# Patient Record
Sex: Male | Born: 2000 | Race: Asian | Hispanic: No | Marital: Single | Smoking: Never smoker
Health system: Southern US, Community
[De-identification: ages and names within clinical notes are randomized; demographics above are authoritative.]

## PROBLEM LIST (undated history)

## (undated) DIAGNOSIS — J45909 Unspecified asthma, uncomplicated: Secondary | ICD-10-CM

---
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
## 2021-05-22 ENCOUNTER — Encounter (HOSPITAL_COMMUNITY): Payer: Self-pay | Admitting: Emergency Medicine

## 2021-05-22 ENCOUNTER — Other Ambulatory Visit: Payer: Self-pay

## 2021-05-22 ENCOUNTER — Emergency Department (HOSPITAL_COMMUNITY)
Admission: EM | Admit: 2021-05-22 | Discharge: 2021-05-22 | Disposition: A | Discharge status: Home / Self Care | Payer: 59 | Attending: Emergency Medicine | Admitting: Emergency Medicine

## 2021-05-22 ENCOUNTER — Emergency Department (HOSPITAL_COMMUNITY): Payer: 59

## 2021-05-22 DIAGNOSIS — W208XXA Other cause of strike by thrown, projected or falling object, initial encounter: Secondary | ICD-10-CM | POA: Diagnosis not present

## 2021-05-22 DIAGNOSIS — Y990 Civilian activity done for income or pay: Secondary | ICD-10-CM | POA: Insufficient documentation

## 2021-05-22 DIAGNOSIS — M79671 Pain in right foot: Secondary | ICD-10-CM | POA: Insufficient documentation

## 2021-05-22 DIAGNOSIS — S92514A Nondisplaced fracture of proximal phalanx of right lesser toe(s), initial encounter for closed fracture: Secondary | ICD-10-CM | POA: Diagnosis not present

## 2021-05-22 DIAGNOSIS — S99921A Unspecified injury of right foot, initial encounter: Secondary | ICD-10-CM | POA: Diagnosis present

## 2021-05-22 DIAGNOSIS — R030 Elevated blood-pressure reading, without diagnosis of hypertension: Secondary | ICD-10-CM

## 2021-05-22 DIAGNOSIS — I10 Essential (primary) hypertension: Secondary | ICD-10-CM | POA: Insufficient documentation

## 2021-05-22 MED ORDER — OXYCODONE-ACETAMINOPHEN 5-325 MG PO TABS
1.0000 | ORAL_TABLET | Freq: Once | ORAL | Status: AC
  Administered 2021-05-22: 1 via ORAL
  Filled 2021-05-22: qty 1

## 2021-05-22 NOTE — ED Triage Notes (Signed)
Pt was opening a trailer at work and an anchor weight fell on R foot/ R 4th and 5th toe 1 hour ago.  C/o pain, swelling, and bruising.  Ice placed on foot at triage.

## 2021-05-22 NOTE — Discharge Instructions (Addendum)
It was our pleasure to provide your ER care today - we hope that you feel better.  Wear comfortable/supportive shoe. Elevate foot. Icepack to sore area.   Take acetaminophen or ibuprofen as need.  You were given pain meds in the ER - no driving for the next 6 hours, or with right foot until clear to [place] so.   Follow up with orthopedist in the next 2-3 weeks if symptoms fail to improve/resolve.  Your blood pressure is high today - see attached info, and follow up with primary care doctor in the next 1-2 weeks for recheck.   Return to ER if worse, new symptoms, fevers, spreading redness, severe/intractable pain, or other concern.

## 2021-05-22 NOTE — ED Provider Notes (Signed)
[facility] EMERGENCY DEPARTMENT Provider Note   CSN: [id number] Arrival date & time: 05/22/21  1436     History  Chief Complaint  Patient presents with   Foot Pain    Johnny Carney is a 21 y.o. male.  Patient c/o contusion to right foot at work today. States heavy object fell onto foot.  Dull pain to area, acute onset, moderate, non radiating. Skin intact. Denies other pain or injury. No associated numbness/weakness.  The history is provided by the patient and medical records.  Foot Pain Pertinent negatives include no chest pain and no shortness of breath.      Home Medications Prior to Admission medications   Medication Sig Start Date End Date Taking? Authorizing Provider  acetaminophen (TYLENOL) 500 MG tablet Take 1 tablet (500 mg total) by mouth every 6 (six) hours as needed. 02/10/14   Piepenbrink, Victorino Dike, PA-C  ibuprofen (ADVIL,MOTRIN) 600 MG tablet Take 1 tablet (600 mg total) by mouth every 6 (six) hours as needed. 09/24/17   Dartha Lodge, PA-C  lidocaine (XYLOCAINE) 2 % solution Use as directed 20 mLs in the mouth or throat every 6 (six) hours as needed for mouth pain. Swish and spit 02/10/14   Piepenbrink, Victorino Dike, PA-C  methocarbamol (ROBAXIN) 500 MG tablet Take 1 tablet (500 mg total) by mouth 2 (two) times daily. 09/24/17   Dartha Lodge, PA-C  ondansetron (ZOFRAN) 4 MG tablet Take 1 tablet (4 mg total) by mouth every 8 (eight) hours as needed for nausea or vomiting. 05/31/17   Georgetta Haber, NP  oxymetazoline (AFRIN NASAL SPRAY) 0.05 % nasal spray Place 1 spray into both nostrils 2 (two) times daily. For any further nosebleeds 02/10/14   Piepenbrink, Victorino Dike, PA-C      Allergies    Patient has no known allergies.    Review of Systems   Review of Systems  Constitutional:  Negative for fever.  Respiratory:  Negative for shortness of breath.   Cardiovascular:  Negative for chest pain.  Musculoskeletal:        Contusion foot  Skin:   Negative for wound.  Neurological:  Negative for numbness.   Physical Exam Updated Vital Signs BP (!) 150/104 (BP Location: Left Arm)    Pulse 74    Temp 98.6 F (37 C) (Oral)    Resp 18    SpO2 98%  Physical Exam Vitals and nursing note reviewed.  Constitutional:      Appearance: Normal appearance. He is well-developed.  HENT:     Head: Atraumatic.     Nose: Nose normal.     Mouth/Throat:     Mouth: Mucous membranes are moist.  Eyes:     General: No scleral icterus.    Conjunctiva/sclera: Conjunctivae normal.  Neck:     Trachea: No tracheal deviation.  Cardiovascular:     Rate and Rhythm: Normal rate.     Pulses: Normal pulses.  Pulmonary:     Effort: Pulmonary effort is normal. No accessory muscle usage or respiratory distress.  Genitourinary:    Comments: No cva tenderness. Musculoskeletal:     Cervical back: Neck supple.     Comments: Mild swelling and tenderness to proximal right small toe. Skin is intact. Normal cap refill distally. Dp/pt palp. No other focal bony tenderness on exam of foot/ankle.   Skin:    General: Skin is warm and dry.     Findings: No rash.  Neurological:     Mental Status: He  is alert.     Comments: Alert, speech clear. Right foot/toes nvi.   Psychiatric:        Mood and Affect: Mood normal.    ED Results / Procedures / Treatments   Labs (all labs ordered are listed, but only abnormal results are displayed) Labs Reviewed - No data to display  EKG None  Radiology DG Foot Complete Right  Result Date: 05/22/2021 CLINICAL DATA:  Large object fell on right foot. EXAM: RIGHT FOOT COMPLETE - 3+ VIEW COMPARISON:  Right great toe radiographs 03/06/2014 FINDINGS: Oblique minimally displaced fracture at the base of the fifth toe proximal phalanx, extending to the lateral aspect of the articular surface. The distal fracture fragment is displaced approximately 1 mm laterally. No significant angulation. The fifth toe MTP joint space is intact. Soft  tissue swelling is noted at the base of the fifth toe. No additional fractures in the right foot. 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 IMPRESSION: Intra-articular oblique minimally displaced fracture of the base of the fifth toe proximal phalanx. The fracture line extends to the lateral aspect of the articular surface. Electronically Signed   By: Sherron Ales M.D.   On: 05/22/2021 15:47    Procedures Procedures    Medications Ordered in ED Medications  oxyCODONE-acetaminophen (PERCOCET/ROXICET) 5-325 MG per tablet 1 tablet (1 tablet Oral Given 05/22/21 1613)    ED Course/ Medical Decision Making/ A&P                           Medical Decision Making Problems Addressed: Elevated blood pressure reading: acute illness or injury that poses a threat to life or bodily functions Nondisplaced fracture of proximal phalanx of right lesser toe(s), initial encounter for closed fracture: acute illness or injury  Amount and/or Complexity of Data Reviewed Radiology: ordered and independent interpretation performed. Decision-making details documented in ED Course.  Risk OTC drugs. Prescription drug management.   Xrays.   Reviewed nursing notes and prior charts for additional history. External reports reviewed - prior urgent care visit, xrays.   No meds pta. Pt has ride, does not have to drive.   Percocet po.  Xrays reviewed/interpreted by me - +fx toe.  Discussed w pt.   Pain controlled. Icepack. Post op shoe. Elevate.   Ortho f/u prn.   Return precautions provided.           Final Clinical Impression(s) / ED Diagnoses Final diagnoses:  Nondisplaced fracture of proximal phalanx of right lesser toe(s), initial encounter for closed fracture  Elevated blood pressure reading    Rx / DC Orders ED Discharge Orders     None         Cathren Laine, MD 05/22/21 1656

## 2022-03-08 ENCOUNTER — Ambulatory Visit (INDEPENDENT_AMBULATORY_CARE_PROVIDER_SITE_OTHER): Payer: 59

## 2022-03-08 ENCOUNTER — Ambulatory Visit (HOSPITAL_COMMUNITY)
Admission: EM | Admit: 2022-03-08 | Discharge: 2022-03-08 | Disposition: A | Discharge status: Home / Self Care | Payer: 59 | Attending: Family Medicine | Admitting: Family Medicine

## 2022-03-08 ENCOUNTER — Encounter (HOSPITAL_COMMUNITY): Payer: Self-pay | Admitting: Emergency Medicine

## 2022-03-08 ENCOUNTER — Other Ambulatory Visit: Payer: Self-pay

## 2022-03-08 DIAGNOSIS — M79671 Pain in right foot: Secondary | ICD-10-CM

## 2022-03-08 DIAGNOSIS — M25562 Pain in left knee: Secondary | ICD-10-CM | POA: Diagnosis not present

## 2022-03-08 DIAGNOSIS — M79641 Pain in right hand: Secondary | ICD-10-CM

## 2022-03-08 DIAGNOSIS — M25531 Pain in right wrist: Secondary | ICD-10-CM

## 2022-03-08 MED ORDER — DICLOFENAC SODIUM 75 MG PO TBEC: 75.0000 mg | DELAYED_RELEASE_TABLET | Freq: Two times a day (BID) | ORAL | 0 refills | Status: DC

## 2022-03-08 MED ORDER — CYCLOBENZAPRINE HCL 10 MG PO TABS: ORAL_TABLET | ORAL | 0 refills | Status: DC
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
## 2022-08-15 ENCOUNTER — Encounter (HOSPITAL_COMMUNITY): Payer: Self-pay | Admitting: *Deleted

## 2022-08-15 ENCOUNTER — Ambulatory Visit (HOSPITAL_COMMUNITY)
Admission: EM | Admit: 2022-08-15 | Discharge: 2022-08-15 | Disposition: A | Discharge status: Home / Self Care | Payer: 59 | Attending: Physician Assistant | Admitting: Physician Assistant

## 2022-08-15 ENCOUNTER — Other Ambulatory Visit: Payer: Self-pay

## 2022-08-15 ENCOUNTER — Ambulatory Visit (INDEPENDENT_AMBULATORY_CARE_PROVIDER_SITE_OTHER): Payer: 59

## 2022-08-15 DIAGNOSIS — J208 Acute bronchitis due to other specified organisms: Secondary | ICD-10-CM | POA: Diagnosis not present

## 2022-08-15 DIAGNOSIS — R051 Acute cough: Secondary | ICD-10-CM

## 2022-08-15 MED ORDER — AEROCHAMBER PLUS FLO-VU LARGE MISC
1.0000 | Freq: Once | Status: AC
  Administered 2022-08-15: 1

## 2022-08-15 MED ORDER — ALBUTEROL SULFATE HFA 108 (90 BASE) MCG/ACT IN AERS
2.0000 | INHALATION_SPRAY | Freq: Once | RESPIRATORY_TRACT | Status: AC
  Administered 2022-08-15: 2 via RESPIRATORY_TRACT

## 2022-08-15 MED ORDER — PROMETHAZINE-DM 6.25-15 MG/5ML PO SYRP: 5.0000 mL | ORAL_SOLUTION | Freq: Two times a day (BID) | ORAL | 0 refills | Status: DC | PRN

## 2022-08-15 MED ORDER — AEROCHAMBER PLUS FLO-VU LARGE MISC
Status: AC
  Filled 2022-08-15: qty 1

## 2022-08-15 MED ORDER — ONDANSETRON 4 MG PO TBDP: 4.0000 mg | ORAL_TABLET | Freq: Three times a day (TID) | ORAL | 0 refills | Status: DC | PRN

## 2022-08-15 MED ORDER — ALBUTEROL SULFATE HFA 108 (90 BASE) MCG/ACT IN AERS
INHALATION_SPRAY | RESPIRATORY_TRACT | Status: AC
  Filled 2022-08-15: qty 6.7

## 2022-08-15 MED ORDER — PREDNISONE 10 MG (21) PO TBPK: ORAL_TABLET | ORAL | 0 refills | Status: DC

## 2022-08-15 NOTE — Discharge Instructions (Addendum)
Your x-ray was normal with no evidence of pneumonia.  I am concerned that you have a viral bronchitis.  Use the albuterol inhaler every 4-6 hours as needed.  Start prednisone taper.  Mims not take NSAIDs with this medication including aspirin, ibuprofen/Advil, naproxen/Aleve, diclofenac/Voltaren.  You can use Tylenol/acetaminophen.  I also recommended nasal saline and sinus rinses.  I have called in Zofran to help with nausea.  Please push fluids and eat a bland diet.  If your symptoms or not improving within a week please return for reevaluation.  If anything worsens you should be seen immediately including worsening cough, shortness of breath, fever, nausea/vomiting despite medication, abdominal pain, recurrent diarrhea.

## 2022-08-15 NOTE — ED Triage Notes (Signed)
Pt reports for 4 days he has had a HA,vomiting ,chills ,[facility] diarrhea,cough. Pt 's friend in room has some of same Sx's.

## 2022-08-15 NOTE — ED Provider Notes (Signed)
[facility]    CSN: [id number] Arrival date & time: 08/15/22  0946      History   Chief Complaint Chief Complaint  Patient presents with   Cough   Sore Throat   Headache   Chills    HPI Johnny Carney is a 22 y.o. male.   Patient presents today with a 4-day history of URI symptoms including cough, congestion, headache, chills, nausea, vomiting, diarrhea.  Denies any fever, chest pain, shortness of breath.  Does report possible sick contacts with similar symptoms but does not know what they were ultimately diagnosed with.  He has been taking TheraFlu, DayQuil, NyQuil, allergy medication without improvement of symptoms.  He has never had COVID.  He has not had COVID-19 vaccination.  Denies any history of allergies, asthma, COPD, smoking.  Denies any recent antibiotics or steroids.  Reports that he has had nausea and vomiting with last episode yesterday evening.  He has been able to drink fluids since then but has not been eating solid food.  He has not had recurrent emesis.  He also reports a few episodes of diarrhea but denies any associated melena or hematochezia.    History reviewed. No pertinent past medical history.  There are no problems to display for this patient.   History reviewed. No pertinent surgical history.     Home Medications    Prior to Admission medications   Medication Sig Start Date End Date Taking? Authorizing Provider  ondansetron (ZOFRAN-ODT) 4 MG disintegrating tablet Take 1 tablet (4 mg total) by mouth every 8 (eight) hours as needed for nausea or vomiting. 08/15/22  Yes Veronnica Hennings K, PA-C  predniSONE (STERAPRED UNI-PAK 21 TAB) 10 MG (21) TBPK tablet As directed 08/15/22  Yes Jayion Schneck K, PA-C  promethazine-dextromethorphan (PROMETHAZINE-DM) 6.25-15 MG/5ML syrup Take 5 mLs by mouth 2 (two) times daily as needed for cough. 08/15/22  Yes Jairo Bellew, Noberto Retort, PA-C  acetaminophen (TYLENOL) 500 MG tablet Take 1 tablet (500 mg total) by mouth every  6 (six) hours as needed. 02/10/14   Piepenbrink, Victorino Dike, PA-C  cyclobenzaprine (FLEXERIL) 10 MG tablet Take 1 tablet by mouth 3 times daily as needed for muscle spasm. Warning: May cause drowsiness. 03/08/22   Mardella Layman, MD  diclofenac (VOLTAREN) 75 MG EC tablet Take 1 tablet (75 mg total) by mouth 2 (two) times daily. 03/08/22   Mardella Layman, MD  ibuprofen (ADVIL,MOTRIN) 600 MG tablet Take 1 tablet (600 mg total) by mouth every 6 (six) hours as needed. 09/24/17   Dartha Lodge, PA-C    Family History History reviewed. No pertinent family history.  Social History Social History   Tobacco Use   Smoking status: Never   Smokeless tobacco: Never  Vaping Use   Vaping Use: Never used  Substance Use Topics   Alcohol use: No   Drug use: No     Allergies   Patient has no known allergies.   Review of Systems Review of Systems  Constitutional:  Positive for activity change and appetite change. Negative for fatigue and fever.  HENT:  Positive for congestion and sore throat. Negative for sinus pressure and sneezing.   Respiratory:  Positive for cough and chest tightness. Negative for shortness of breath.   Cardiovascular:  Negative for chest pain.  Gastrointestinal:  Positive for diarrhea, nausea and vomiting. Negative for abdominal pain.  Musculoskeletal:  Negative for arthralgias and myalgias.  Neurological:  Negative for dizziness, light-headedness and headaches.     Physical  Exam Triage Vital Signs ED Triage Vitals  Enc Vitals Group     BP 08/15/22 1106 (!) 144/89     Pulse Rate 08/15/22 1106 83     Resp 08/15/22 1106 18     Temp 08/15/22 1106 99 F (37.2 C)     Temp src --      SpO2 08/15/22 1106 94 %     Weight --      Height --      Head Circumference --      Peak Flow --      Pain Score 08/15/22 1104 7     Pain Loc --      Pain Edu? --      Excl. in GC? --    No data found.  Updated Vital Signs BP (!) 144/89   Pulse 83   Temp 99 F (37.2 C)   Resp 18    SpO2 94%   Visual Acuity Right Eye Distance:   Left Eye Distance:   Bilateral Distance:    Right Eye Near:   Left Eye Near:    Bilateral Near:     Physical Exam Vitals reviewed.  Constitutional:      General: He is awake.     Appearance: Normal appearance. He is well-developed. He is not ill-appearing.     Comments: Very pleasant male appears stated age in no acute distress sitting comfortably in exam room  HENT:     Head: Normocephalic and atraumatic.     Right Ear: Tympanic membrane, ear canal and external ear normal. Tympanic membrane is not erythematous or bulging.     Left Ear: Tympanic membrane, ear canal and external ear normal. Tympanic membrane is not erythematous or bulging.     Nose: Nose normal.     Mouth/Throat:     Pharynx: Uvula midline. Posterior oropharyngeal erythema present. No oropharyngeal exudate.     Tonsils: No tonsillar exudate or tonsillar abscesses.  Cardiovascular:     Rate and Rhythm: Normal rate and regular rhythm.     Heart sounds: Normal heart sounds, S1 normal and S2 normal. No murmur heard. Pulmonary:     Effort: Pulmonary effort is normal. No accessory muscle usage or respiratory distress.     Breath sounds: No stridor. Examination of the right-lower field reveals wheezing. Examination of the left-lower field reveals wheezing. Wheezing present. No rhonchi or rales.     Comments: Reactive cough with deep breathing Lymphadenopathy:     Head:     Right side of head: No submental, submandibular or tonsillar adenopathy.     Left side of head: No submental, submandibular or tonsillar adenopathy.     Cervical: No cervical adenopathy.  Neurological:     Mental Status: He is alert.  Psychiatric:        Behavior: Behavior is cooperative.      UC Treatments / Results  Labs (all labs ordered are listed, but only abnormal results are displayed) Labs Reviewed - No data to display  EKG   Radiology DG Chest 2 View  Result Date:  08/15/2022 CLINICAL DATA:  Persistent cough EXAM: CHEST - 2 VIEW COMPARISON:  None Available. FINDINGS: Cardiac and mediastinal contours are within normal limits. No focal pulmonary opacity. No pleural effusion or pneumothorax. No acute osseous abnormality. IMPRESSION: No acute cardiopulmonary process. Electronically Signed   By: Wiliam Ke M.D.   On: 08/15/2022 12:49    Procedures Procedures (including critical care time)  Medications Ordered in UC Medications  albuterol (VENTOLIN HFA) 108 (90 Base) MCG/ACT inhaler 2 puff (2 puffs Inhalation Given 08/15/22 1125)  AeroChamber Plus Flo-Vu Large MISC 1 each (1 each Other Given 08/15/22 1125)    Initial Impression / Assessment and Plan / UC Course  I have reviewed the triage vital signs and the nursing notes.  Pertinent labs & imaging results that were available during my care of the patient were reviewed by me and considered in my medical decision making (see chart for details).     Patient is well-appearing, afebrile, nontoxic, nontachycardic.  He did have reactive cough and wheezing so was given albuterol with improvement of symptoms and oxygen saturation improved to 98%.  Chest x-ray was obtained given persistent cough that showed no acute cardiopulmonary disease.  Suspect viral bronchitis as etiology of symptoms.  We did discuss potential utility of COVID testing but patient is already at day 4 and he is young and otherwise healthy is not a candidate for antiviral therapy so this would not change her management and therefore was deferred.  Will treat with prednisone.  Offered injection of steroids in clinic but patient declined this.  He was instructed to avoid NSAIDs with prednisone.  He was sent home with albuterol to use every 4-6 hours.  He does report ongoing nausea so was given Zofran to help manage the symptoms.  He was prescribed Promethazine DM for cough.  Discussed that this can be sedating and he is not to drive or drink alcohol with  taking it.  He can use over-the-counter medications for additional symptom relief.  Discussed that if symptoms are improving within a week he should return for reevaluation.  If he has any worsening symptoms he needs to be seen immediately.  Strict return precautions given.  Work excuse note provided.   Final Clinical Impressions(s) / UC Diagnoses   Final diagnoses:  Viral bronchitis  Acute cough     Discharge Instructions      Your x-ray was normal with no evidence of pneumonia.  I am concerned that you have a viral bronchitis.  Use the albuterol inhaler every 4-6 hours as needed.  Start prednisone taper.  Legore not take NSAIDs with this medication including aspirin, ibuprofen/Advil, naproxen/Aleve, diclofenac/Voltaren.  You can use Tylenol/acetaminophen.  I also recommended nasal saline and sinus rinses.  I have called in Zofran to help with nausea.  Please push fluids and eat a bland diet.  If your symptoms or not improving within a week please return for reevaluation.  If anything worsens you should be seen immediately including worsening cough, shortness of breath, fever, nausea/vomiting despite medication, abdominal pain, recurrent diarrhea.     ED Prescriptions     Medication Sig Dispense Auth. Provider   predniSONE (STERAPRED UNI-PAK 21 TAB) 10 MG (21) TBPK tablet As directed 21 tablet Livingston Denner K, PA-C   ondansetron (ZOFRAN-ODT) 4 MG disintegrating tablet Take 1 tablet (4 mg total) by mouth every 8 (eight) hours as needed for nausea or vomiting. 20 tablet Zameria Vogl K, PA-C   promethazine-dextromethorphan (PROMETHAZINE-DM) 6.25-15 MG/5ML syrup Take 5 mLs by mouth 2 (two) times daily as needed for cough. 118 mL Elsye Mccollister K, PA-C      PDMP not reviewed this encounter.   Jeani Hawking, PA-C 08/15/22 1312

## 2022-08-27 ENCOUNTER — Ambulatory Visit (HOSPITAL_COMMUNITY)
Admission: EM | Admit: 2022-08-27 | Discharge: 2022-08-27 | Disposition: A | Discharge status: Home / Self Care | Payer: 59 | Attending: Emergency Medicine | Admitting: Emergency Medicine

## 2022-08-27 ENCOUNTER — Other Ambulatory Visit: Payer: Self-pay

## 2022-08-27 ENCOUNTER — Encounter (HOSPITAL_COMMUNITY): Payer: Self-pay | Admitting: *Deleted

## 2022-08-27 DIAGNOSIS — J069 Acute upper respiratory infection, unspecified: Secondary | ICD-10-CM

## 2022-08-27 DIAGNOSIS — H109 Unspecified conjunctivitis: Secondary | ICD-10-CM | POA: Diagnosis not present

## 2022-08-27 DIAGNOSIS — R051 Acute cough: Secondary | ICD-10-CM | POA: Diagnosis not present

## 2022-08-27 MED ORDER — AMOXICILLIN-POT CLAVULANATE 875-125 MG PO TABS: 1.0000 | ORAL_TABLET | Freq: Two times a day (BID) | ORAL | 0 refills | Status: DC

## 2022-08-27 MED ORDER — GENTAMICIN SULFATE 0.3 % OP SOLN: 1.0000 [drp] | OPHTHALMIC | 0 refills | Status: AC

## 2022-08-27 MED ORDER — PROMETHAZINE-DM 6.25-15 MG/5ML PO SYRP: 5.0000 mL | ORAL_SOLUTION | Freq: Four times a day (QID) | ORAL | 0 refills | Status: DC | PRN

## 2022-08-27 MED ORDER — BENZONATATE 200 MG PO CAPS: 200.0000 mg | ORAL_CAPSULE | Freq: Three times a day (TID) | ORAL | 0 refills | Status: AC

## 2022-08-27 NOTE — ED Provider Notes (Signed)
[facility]    CSN: [id number] Arrival date & time: 08/27/22  1732      History   Chief Complaint Chief Complaint  Patient presents with   Cough   Headache   Eye Drainage    HPI Johnny Carney is a 22 y.o. male.   Patient presents to clinic for ongoing persistent cough since prior to his May 14 visit.  He has since ran out of his cough medicine and returns to clinic.  He has a low-grade fever and is mildly tachycardic.  He did not finish all of his steroids as prescribed.  Denies shortness of breath or wheezing.  Reports 2 episode of posttussive emesis.  He also woke up yesterday morning with his right eye matted shut with crusted yellow discharge.  Reports his eye is red and has a gritty sensation.  Feels like his eye is swollen.  Does not wear contacts.    The history is provided by the patient and medical records.  Cough Associated symptoms: headaches   Associated symptoms: no chest pain, no shortness of breath and no wheezing   Headache Associated symptoms: congestion, cough, sinus pressure and vomiting   Associated symptoms: no abdominal pain     History reviewed. No pertinent past medical history.  There are no problems to display for this patient.   History reviewed. No pertinent surgical history.     Home Medications    Prior to Admission medications   Medication Sig Start Date End Date Taking? Authorizing Provider  amoxicillin-clavulanate (AUGMENTIN) 875-125 MG tablet Take 1 tablet by mouth every 12 (twelve) hours. 08/27/22  Yes Rinaldo Ratel, [place] N, FNP  benzonatate (TESSALON) 200 MG capsule Take 1 capsule (200 mg total) by mouth every 8 (eight) hours for 7 days. 08/27/22 09/03/22 Yes Rinaldo Ratel, [place] N, FNP  gentamicin (GARAMYCIN) 0.3 % ophthalmic solution Place 1 drop into the right eye every 4 (four) hours for 5 days. 08/27/22 09/01/22 Yes Rinaldo Ratel, [place] N, FNP  promethazine-dextromethorphan (PROMETHAZINE-DM) 6.25-15 MG/5ML syrup Take 5 mLs by  mouth 4 (four) times daily as needed for cough. 08/27/22  Yes Rinaldo Ratel, [place] N, FNP  acetaminophen (TYLENOL) 500 MG tablet Take 1 tablet (500 mg total) by mouth every 6 (six) hours as needed. 02/10/14   Piepenbrink, Victorino Dike, PA-C  cyclobenzaprine (FLEXERIL) 10 MG tablet Take 1 tablet by mouth 3 times daily as needed for muscle spasm. Warning: May cause drowsiness. 03/08/22   Mardella Layman, MD  diclofenac (VOLTAREN) 75 MG EC tablet Take 1 tablet (75 mg total) by mouth 2 (two) times daily. 03/08/22   Mardella Layman, MD  ibuprofen (ADVIL,MOTRIN) 600 MG tablet Take 1 tablet (600 mg total) by mouth every 6 (six) hours as needed. 09/24/17   Dartha Lodge, PA-C  ondansetron (ZOFRAN-ODT) 4 MG disintegrating tablet Take 1 tablet (4 mg total) by mouth every 8 (eight) hours as needed for nausea or vomiting. 08/15/22   Raspet, Noberto Retort, PA-C  predniSONE (STERAPRED UNI-PAK 21 TAB) 10 MG (21) TBPK tablet As directed 08/15/22   Raspet, Noberto Retort, PA-C    Family History History reviewed. No pertinent family history.  Social History Social History   Tobacco Use   Smoking status: Never   Smokeless tobacco: Never  Vaping Use   Vaping Use: Never used  Substance Use Topics   Alcohol use: No   Drug use: No     Allergies   Patient has no known allergies.   Review of Systems Review of Systems  HENT:  Positive for congestion and sinus pressure.   Respiratory:  Positive for cough. Negative for shortness of breath and wheezing.   Cardiovascular:  Negative for chest pain.  Gastrointestinal:  Positive for vomiting. Negative for abdominal pain.  Neurological:  Positive for headaches.     Physical Exam Triage Vital Signs ED Triage Vitals  Enc Vitals Group     BP 08/27/22 1744 127/83     Pulse Rate 08/27/22 1744 (!) 103     Resp 08/27/22 1744 18     Temp 08/27/22 1744 99.3 F (37.4 C)     Temp src --      SpO2 08/27/22 1744 94 %     Weight --      Height --      Head Circumference --      Peak Flow --       Pain Score 08/27/22 1742 7     Pain Loc --      Pain Edu? --      Excl. in GC? --    No data found.  Updated Vital Signs BP 127/83   Pulse (!) 103   Temp 99.3 F (37.4 C)   Resp 18   SpO2 94%   Visual Acuity Right Eye Distance:   Left Eye Distance:   Bilateral Distance:    Right Eye Near:   Left Eye Near:    Bilateral Near:     Physical Exam Vitals and nursing note reviewed.  Constitutional:      Appearance: Normal appearance.  HENT:     Head: Normocephalic and atraumatic.     Right Ear: External ear normal.     Left Ear: External ear normal.     Nose: Congestion present.     Mouth/Throat:     Pharynx: Posterior oropharyngeal erythema present.  Eyes:     General: No scleral icterus.       Right eye: Discharge present.  Cardiovascular:     Rate and Rhythm: Normal rate and regular rhythm.     Heart sounds: Normal heart sounds. No murmur heard. Pulmonary:     Effort: Pulmonary effort is normal. No respiratory distress.     Breath sounds: Normal breath sounds.  Musculoskeletal:        General: No swelling. Normal range of motion.     Cervical back: Normal range of motion.  Lymphadenopathy:     Cervical: Cervical adenopathy present.  Skin:    General: Skin is warm and dry.  Neurological:     General: No focal deficit present.     Mental Status: He is alert and oriented to person, place, and time.  Psychiatric:        Mood and Affect: Mood normal.        Behavior: Behavior normal.      UC Treatments / Results  Labs (all labs ordered are listed, but only abnormal results are displayed) Labs Reviewed - No data to display  EKG   Radiology No results found.  Procedures Procedures (including critical care time)  Medications Ordered in UC Medications - No data to display  Initial Impression / Assessment and Plan / UC Course  I have reviewed the triage vital signs and the nursing notes.  Pertinent labs & imaging results that were available  during my care of the patient were reviewed by me and considered in my medical decision making (see chart for details).  Vitals in triage reviewed, patient is hemodynamically stable.  Lungs vesicular  posteriorly.  Posterior pharynx with erythema, reports sinus congestion.  Symptoms have been persistent for over 2 weeks now, will cover with Augmentin for bacterial upper respiratory tract infection.  Cough medication provided and symptomatic management discussed.  Deferred repeat imaging as lungs were clear.  Plan of care, follow-up care and return precautions discussed, no questions at this time.     Final Clinical Impressions(s) / UC Diagnoses   Final diagnoses:  Upper respiratory tract infection, unspecified type  Acute cough  Bacterial conjunctivitis of right eye     Discharge Instructions      I am covering you with antibiotics for bacterial upper respiratory tract infection.  Please take all antibiotics as prescribed until finished.  You can take the Tessalon Perles throughout the day and the promethazine syrup at night.  Please continue alternating between Tylenol Motrin as needed for fever, body aches and discomfort.  Please use the antibiotic eyedrops as prescribed.  Please wash your hands prior to and after application.     ED Prescriptions     Medication Sig Dispense Auth. Provider   amoxicillin-clavulanate (AUGMENTIN) 875-125 MG tablet Take 1 tablet by mouth every 12 (twelve) hours. 14 tablet Rinaldo Ratel, [place] N, [place]   benzonatate (TESSALON) 200 MG capsule Take 1 capsule (200 mg total) by mouth every 8 (eight) hours for 7 days. 21 capsule Rinaldo Ratel, [place] N, [place]   promethazine-dextromethorphan (PROMETHAZINE-DM) 6.25-15 MG/5ML syrup Take 5 mLs by mouth 4 (four) times daily as needed for cough. 118 mL Rinaldo Ratel, [place] N, [place]   gentamicin (GARAMYCIN) 0.3 % ophthalmic solution Place 1 drop into the right eye every 4 (four) hours for 5 days. 5 mL Nadya Hopwood, [place] N, FNP       PDMP not reviewed this encounter.   Suzanna Zahn, [place] N, [place] 08/27/22 [phone]

## 2022-08-27 NOTE — ED Triage Notes (Signed)
Pt reports for past 3 days he has had a cough ,HA,Rt eye drainage . Pt also vomited x 2 this morning. Pt last the ability to smell and taste.

## 2022-08-27 NOTE — Discharge Instructions (Addendum)
I am covering you with antibiotics for bacterial upper respiratory tract infection.  Please take all antibiotics as prescribed until finished.  You can take the Tessalon Perles throughout the day and the promethazine syrup at night.  Please continue alternating between Tylenol Motrin as needed for fever, body aches and discomfort.  Please use the antibiotic eyedrops as prescribed.  Please wash your hands prior to and after application.

## 2023-08-19 ENCOUNTER — Encounter (HOSPITAL_COMMUNITY): Payer: Self-pay | Admitting: Emergency Medicine

## 2023-08-19 ENCOUNTER — Ambulatory Visit (HOSPITAL_COMMUNITY)
Admission: EM | Admit: 2023-08-19 | Discharge: 2023-08-19 | Disposition: A | Discharge status: Home / Self Care | Attending: Family Medicine | Admitting: Family Medicine

## 2023-08-19 ENCOUNTER — Ambulatory Visit (INDEPENDENT_AMBULATORY_CARE_PROVIDER_SITE_OTHER)

## 2023-08-19 ENCOUNTER — Other Ambulatory Visit: Payer: Self-pay

## 2023-08-19 DIAGNOSIS — R051 Acute cough: Secondary | ICD-10-CM | POA: Diagnosis not present

## 2023-08-19 DIAGNOSIS — J4521 Mild intermittent asthma with (acute) exacerbation: Secondary | ICD-10-CM

## 2023-08-19 MED ORDER — PREDNISONE 20 MG PO TABS: 40.0000 mg | ORAL_TABLET | Freq: Every day | ORAL | 0 refills | Status: AC

## 2023-08-19 MED ORDER — BENZONATATE 100 MG PO CAPS: 100.0000 mg | ORAL_CAPSULE | Freq: Three times a day (TID) | ORAL | 0 refills | Status: DC | PRN

## 2023-08-19 MED ORDER — IPRATROPIUM-ALBUTEROL 0.5-2.5 (3) MG/3ML IN SOLN
RESPIRATORY_TRACT | Status: AC
  Filled 2023-08-19: qty 3

## 2023-08-19 MED ORDER — IPRATROPIUM-ALBUTEROL 0.5-2.5 (3) MG/3ML IN SOLN
3.0000 mL | Freq: Once | RESPIRATORY_TRACT | Status: AC
  Administered 2023-08-19: 3 mL via RESPIRATORY_TRACT

## 2023-08-19 NOTE — ED Triage Notes (Signed)
 Pt reports persistent cough, chest congestion and wheezing for the past 2 weeks. Using rescue inhaler with not relief.

## 2023-08-19 NOTE — Discharge Instructions (Signed)
 Chest x-ray by my review was negative.  The radiologist will also read your x-ray, and if their interpretation differs significantly from mine, and the management of your condition would change, we will call you.  Take prednisone  20 mg--2 daily for 5 days  Take benzonatate  100 mg, 1 tab every 8 hours as needed for cough.  We gave you 1 breathing treatment of albuterol /ipratropium

## 2023-08-19 NOTE — ED Provider Notes (Signed)
 [facility]    CSN: [id number] Arrival date & time: 08/19/23  1645      History   Chief Complaint Chief Complaint  Patient presents with   Cough    HPI Johnny Carney is a 23 y.o. male.    Cough Here for cough and wheezing/chest tightness. Symptoms began about 2 weeks ago.  No rhinorrhea or postnasal drainage or fever or chills.  No vomiting or diarrhea.  He was prescribed an inhaler after few weeks ago for possible bronchitis.  For this illness the inhaler of albuterol  has not been helping as much.  NKDA  History reviewed. No pertinent past medical history.  There are no active problems to display for this patient.   History reviewed. No pertinent surgical history.     Home Medications    Prior to Admission medications   Medication Sig Start Date End Date Taking? Authorizing Provider  benzonatate  (TESSALON ) 100 MG capsule Take 1 capsule (100 mg total) by mouth 3 (three) times daily as needed for cough. 08/19/23  Yes Ann Keto, MD  predniSONE  (DELTASONE ) 20 MG tablet Take 2 tablets (40 mg total) by mouth daily with breakfast for 5 days. 08/19/23 08/24/23 Yes Dena Esperanza, Paige Boatman, MD  acetaminophen  (TYLENOL ) 500 MG tablet Take 1 tablet (500 mg total) by mouth every 6 (six) hours as needed. 02/10/14   Piepenbrink, Bridgette Campus, PA-C  cyclobenzaprine  (FLEXERIL ) 10 MG tablet Take 1 tablet by mouth 3 times daily as needed for muscle spasm. Warning: May cause drowsiness. 03/08/22   Afton Albright, MD    Family History History reviewed. No pertinent family history.  Social History Social History   Tobacco Use   Smoking status: Never   Smokeless tobacco: Never  Vaping Use   Vaping status: Never Used  Substance Use Topics   Alcohol use: No   Drug use: No     Allergies   Patient has no known allergies.   Review of Systems Review of Systems  Respiratory:  Positive for cough.      Physical Exam Triage Vital Signs ED Triage Vitals [08/19/23  1722]  Encounter Vitals Group     BP 135/85     Systolic BP Percentile      Diastolic BP Percentile      Pulse Rate 87     Resp 20     Temp 98.2 F (36.8 C)     Temp Source Oral     SpO2 98 %     Weight      Height      Head Circumference      Peak Flow      Pain Score 0     Pain Loc      Pain Education      Exclude from Growth Chart    No data found.  Updated Vital Signs BP 135/85 (BP Location: Right Arm)   Pulse 87   Temp 98.2 F (36.8 C) (Oral)   Resp 20   SpO2 98%   Visual Acuity Right Eye Distance:   Left Eye Distance:   Bilateral Distance:    Right Eye Near:   Left Eye Near:    Bilateral Near:     Physical Exam Vitals reviewed.  Constitutional:      General: He is not in acute distress.    Appearance: He is not ill-appearing, toxic-appearing or diaphoretic.  HENT:     Mouth/Throat:     Mouth: Mucous membranes are moist.  Eyes:  Extraocular Movements: Extraocular movements intact.     Pupils: Pupils are equal, round, and reactive to light.  Cardiovascular:     Rate and Rhythm: Normal rate and regular rhythm.     Heart sounds: No murmur heard. Pulmonary:     Effort: No respiratory distress.     Breath sounds: No stridor. No rhonchi or rales.     Comments: Expiratory phase is quiet, but I [place] hear air movement.  There are wheezes when he coughs. Musculoskeletal:     Cervical back: Neck supple.  Lymphadenopathy:     Cervical: No cervical adenopathy.  Skin:    Coloration: Skin is not jaundiced or pale.  Neurological:     General: No focal deficit present.     Mental Status: He is alert and oriented to person, place, and time.  Psychiatric:        Behavior: Behavior normal.      UC Treatments / Results  Labs (all labs ordered are listed, but only abnormal results are displayed) Labs Reviewed - No data to display  EKG   Radiology No results found.  Procedures Procedures (including critical care time)  Medications Ordered in  UC Medications  ipratropium-albuterol  (DUONEB) 0.5-2.5 (3) MG/3ML nebulizer solution 3 mL (3 mLs Nebulization Given 08/19/23 1749)    Initial Impression / Assessment and Plan / UC Course  I have reviewed the triage vital signs and the nursing notes.  Pertinent labs & imaging results that were available during my care of the patient were reviewed by me and considered in my medical decision making (see chart for details).     Chest x-ray by my review is negative for any x-ray.  He did feel better after the DuoNeb treatment. Air movement was slightly improved and there was no wheezing.  Prednisone  and Tessalon  Perles were sent in for the asthma exacerbation and the cough.  Staff will help him get a primary care appointment Final Clinical Impressions(s) / UC Diagnoses   Final diagnoses:  Acute cough  Mild intermittent asthma with exacerbation     Discharge Instructions      Chest x-ray by my review was negative.  The radiologist will also read your x-ray, and if their interpretation differs significantly from mine, and the management of your condition would change, we will call you.  Take prednisone  20 mg--2 daily for 5 days  Take benzonatate  100 mg, 1 tab every 8 hours as needed for cough.  We gave you 1 breathing treatment of albuterol /ipratropium   ED Prescriptions     Medication Sig Dispense Auth. Provider   benzonatate  (TESSALON ) 100 MG capsule Take 1 capsule (100 mg total) by mouth 3 (three) times daily as needed for cough. 21 capsule Ann Keto, MD   predniSONE  (DELTASONE ) 20 MG tablet Take 2 tablets (40 mg total) by mouth daily with breakfast for 5 days. 10 tablet Ellsworth Haas Cassundra Mckeever K, MD      PDMP not reviewed this encounter.   Ann Keto, MD 08/19/23 [phone]

## 2023-08-24 ENCOUNTER — Ambulatory Visit: Admitting: Family Medicine

## 2023-08-28 ENCOUNTER — Ambulatory Visit (INDEPENDENT_AMBULATORY_CARE_PROVIDER_SITE_OTHER)

## 2023-08-28 ENCOUNTER — Encounter (HOSPITAL_COMMUNITY): Payer: Self-pay

## 2023-08-28 ENCOUNTER — Ambulatory Visit (HOSPITAL_COMMUNITY)
Admission: EM | Admit: 2023-08-28 | Discharge: 2023-08-28 | Disposition: A | Discharge status: Home / Self Care | Attending: Emergency Medicine | Admitting: Emergency Medicine

## 2023-08-28 DIAGNOSIS — R053 Chronic cough: Secondary | ICD-10-CM

## 2023-08-28 MED ORDER — PROMETHAZINE-DM 6.25-15 MG/5ML PO SYRP: 5.0000 mL | ORAL_SOLUTION | Freq: Four times a day (QID) | ORAL | 0 refills | Status: DC | PRN

## 2023-08-28 MED ORDER — ALBUTEROL SULFATE HFA 108 (90 BASE) MCG/ACT IN AERS: 2.0000 | INHALATION_SPRAY | Freq: Four times a day (QID) | RESPIRATORY_TRACT | 1 refills | Status: DC | PRN

## 2023-08-28 MED ORDER — DOXYCYCLINE HYCLATE 100 MG PO CAPS: 100.0000 mg | ORAL_CAPSULE | Freq: Two times a day (BID) | ORAL | 0 refills | Status: AC

## 2023-08-28 NOTE — Discharge Instructions (Addendum)
 Doxycycline antibiotic twice daily for 7 days Take with food to avoid upset stomach. Finish all the pills It might take 3-4 days to start working  Use albuterol  inhaler three times daily for the next 4-5 days in a row  The promethazine  DM cough syrup can be used up to 4 times daily. If this medication makes you drowsy, take only once before bed.  Any worsening symptoms please go to the emergency department   You can scan the QR code on the last page to get established with a primary care provider

## 2023-08-28 NOTE — ED Triage Notes (Signed)
 Pt states seen and tx'd on 5/18 for same with no relief. C/o cough 3 wks and now coughing up blood x3 days.

## 2023-08-28 NOTE — ED Provider Notes (Signed)
 [facility]    CSN: [id number] Arrival date & time: 08/28/23  1752      History   Chief Complaint Chief Complaint  Patient presents with   Cough    HPI Johnny Carney is a 23 y.o. male.  3 week history of cough Seen 9 days ago for this and had wheezing, was given duoneb that improved symptoms. Chest xray was negative. Sent home with tessalon  and prednisone  burst.   He is here today concerned that cough has persisted. Had a few episodes of pink tinged sputum after coughing. No bright red blood or copious amounts. No new fevers No wheezing or chest tightness Feels short of breath during cough fits  History reviewed. No pertinent past medical history.  There are no active problems to display for this patient.   History reviewed. No pertinent surgical history.     Home Medications    Prior to Admission medications   Medication Sig Start Date End Date Taking? Authorizing Provider  albuterol  (VENTOLIN  HFA) 108 (90 Base) MCG/ACT inhaler Inhale 2 puffs into the lungs every 6 (six) hours as needed for wheezing or shortness of breath. 08/28/23  Yes Yaritzy Huser, Ivette Marks, PA-C  doxycycline (VIBRAMYCIN) 100 MG capsule Take 1 capsule (100 mg total) by mouth 2 (two) times daily for 7 days. 08/28/23 09/04/23 Yes Lottie Siska, Ivette Marks, PA-C  promethazine -dextromethorphan (PROMETHAZINE -DM) 6.25-15 MG/5ML syrup Take 5 mLs by mouth 4 (four) times daily as needed for cough. 08/28/23  Yes Channah Godeaux, Ivette Marks, PA-C  acetaminophen  (TYLENOL ) 500 MG tablet Take 1 tablet (500 mg total) by mouth every 6 (six) hours as needed. 02/10/14   Piepenbrink, Bridgette Campus, PA-C  cyclobenzaprine  (FLEXERIL ) 10 MG tablet Take 1 tablet by mouth 3 times daily as needed for muscle spasm. Warning: May cause drowsiness. 03/08/22   Afton Albright, MD    Family History History reviewed. No pertinent family history.  Social History Social History   Tobacco Use   Smoking status: Never   Smokeless tobacco: Never  Vaping Use    Vaping status: Never Used  Substance Use Topics   Alcohol use: No   Drug use: No     Allergies   Patient has no known allergies.   Review of Systems Review of Systems  Respiratory:  Positive for cough.    Per HPI  Physical Exam Triage Vital Signs ED Triage Vitals [08/28/23 1915]  Encounter Vitals Group     BP 139/75     Systolic BP Percentile      Diastolic BP Percentile      Pulse Rate 68     Resp 18     Temp 98 F (36.7 C)     Temp Source Oral     SpO2 96 %     Weight      Height      Head Circumference      Peak Flow      Pain Score 3     Pain Loc      Pain Education      Exclude from Growth Chart    No data found.  Updated Vital Signs BP 139/75 (BP Location: Left Arm)   Pulse 68   Temp 98 F (36.7 C) (Oral)   Resp 18   SpO2 96%   Physical Exam Vitals and nursing note reviewed.  Constitutional:      Appearance: He is not ill-appearing.  HENT:     Nose: No congestion or rhinorrhea.     Mouth/Throat:  Mouth: Mucous membranes are moist.     Pharynx: Oropharynx is clear. No posterior oropharyngeal erythema.  Eyes:     Conjunctiva/sclera: Conjunctivae normal.  Cardiovascular:     Rate and Rhythm: Normal rate and regular rhythm.     Pulses: Normal pulses.     Heart sounds: Normal heart sounds.  Pulmonary:     Effort: Pulmonary effort is normal. No respiratory distress.     Breath sounds: Normal breath sounds. No wheezing, rhonchi or rales.     Comments: Clear lungs. Frequent dry sounding cough Musculoskeletal:     Cervical back: Normal range of motion.  Lymphadenopathy:     Cervical: No cervical adenopathy.  Skin:    General: Skin is warm and dry.  Neurological:     Mental Status: He is alert and oriented to person, place, and time.     UC Treatments / Results  Labs (all labs ordered are listed, but only abnormal results are displayed) Labs Reviewed - No data to display  EKG   Radiology DG Chest 2 View Result Date:  08/28/2023 CLINICAL DATA:  cough x 3 weeks EXAM: CHEST - 2 VIEW COMPARISON:  08/19/2023. FINDINGS: Cardiac silhouette is unremarkable. No pneumothorax or pleural effusion. The lungs are clear. The visualized skeletal structures are unremarkable. IMPRESSION: No acute cardiopulmonary process. Electronically Signed   By: Sydell Eva M.D.   On: 08/28/2023 19:50    Procedures Procedures (including critical care time)  Medications Ordered in UC Medications - No data to display  Initial Impression / Assessment and Plan / UC Course  I have reviewed the triage vital signs and the nursing notes.  Pertinent labs & imaging results that were available during my care of the patient were reviewed by me and considered in my medical decision making (see chart for details).  Afebrile, well appearing, clear lungs. Vitals stable, normocardic.  Chest xray today again negative. Images independently reviewed by me, agree with radiology interpretation. With duration treat for bacterial etiology. Doxycycline BID x 7 days. Also sent promethazine  DM and new albuterol  inhaler. Patient declines IM steroid tonight  Return and ED precautions   Final Clinical Impressions(s) / UC Diagnoses   Final diagnoses:  Persistent cough for 3 weeks or longer     Discharge Instructions      Doxycycline antibiotic twice daily for 7 days Take with food to avoid upset stomach. Finish all the pills It might take 3-4 days to start working  Use albuterol  inhaler three times daily for the next 4-5 days in a row  The promethazine  DM cough syrup can be used up to 4 times daily. If this medication makes you drowsy, take only once before bed.  Any worsening symptoms please go to the emergency department   You can scan the QR code on the last page to get established with a primary care provider    ED Prescriptions     Medication Sig Dispense Auth. Provider   albuterol  (VENTOLIN  HFA) 108 (90 Base) MCG/ACT inhaler Inhale 2  puffs into the lungs every 6 (six) hours as needed for wheezing or shortness of breath. 8 g Kaylib Furness, PA-C   promethazine -dextromethorphan (PROMETHAZINE -DM) 6.25-15 MG/5ML syrup Take 5 mLs by mouth 4 (four) times daily as needed for cough. 240 mL Hudsyn Champine, PA-C   doxycycline (VIBRAMYCIN) 100 MG capsule Take 1 capsule (100 mg total) by mouth 2 (two) times daily for 7 days. 14 capsule Zollie Clemence, Ivette Marks, PA-C      PDMP not reviewed  this encounter.   Runa Whittingham, Beth Brooke 08/28/23 2005

## 2023-10-23 ENCOUNTER — Ambulatory Visit (HOSPITAL_COMMUNITY)
Admission: EM | Admit: 2023-10-23 | Discharge: 2023-10-23 | Disposition: A | Discharge status: Home / Self Care | Attending: Family Medicine | Admitting: Family Medicine

## 2023-10-23 ENCOUNTER — Encounter (HOSPITAL_COMMUNITY): Payer: Self-pay

## 2023-10-23 ENCOUNTER — Ambulatory Visit (INDEPENDENT_AMBULATORY_CARE_PROVIDER_SITE_OTHER)

## 2023-10-23 DIAGNOSIS — R058 Other specified cough: Secondary | ICD-10-CM

## 2023-10-23 LAB — POC COVID19/FLU A&B COMBO
Covid Antigen, POC: NEGATIVE
Influenza A Antigen, POC: NEGATIVE
Influenza B Antigen, POC: NEGATIVE

## 2023-10-23 MED ORDER — PANTOPRAZOLE SODIUM 40 MG PO TBEC: 40.0000 mg | DELAYED_RELEASE_TABLET | Freq: Every day | ORAL | 2 refills | Status: AC

## 2023-10-23 MED ORDER — PANTOPRAZOLE SODIUM 40 MG PO TBEC: 40.0000 mg | DELAYED_RELEASE_TABLET | Freq: Every day | ORAL | 2 refills | Status: DC

## 2023-10-23 MED ORDER — HYDROCODONE BIT-HOMATROP MBR 5-1.5 MG/5ML PO SOLN: 5.0000 mL | Freq: Four times a day (QID) | ORAL | 0 refills | Status: AC | PRN

## 2023-10-23 MED ORDER — ALBUTEROL SULFATE HFA 108 (90 BASE) MCG/ACT IN AERS: 2.0000 | INHALATION_SPRAY | Freq: Four times a day (QID) | RESPIRATORY_TRACT | 2 refills | Status: DC | PRN

## 2023-10-23 MED ORDER — HYDROCODONE BIT-HOMATROP MBR 5-1.5 MG/5ML PO SOLN: 5.0000 mL | Freq: Four times a day (QID) | ORAL | 0 refills | Status: DC | PRN

## 2023-10-23 NOTE — Discharge Instructions (Addendum)
 Be aware, your cough medication may cause drowsiness. Please do not drive, operate heavy machinery or make important decisions while on this medication, it can cloud your judgement.

## 2023-10-23 NOTE — ED Triage Notes (Signed)
 Pt states cough for the past 3 months.  Seen here 3 times for the same.  States now he has the chills and headache for the past 2 days.

## 2023-10-24 NOTE — ED Provider Notes (Signed)
 [facility]   [id number] 10/23/23 Arrival Time: 8161  ASSESSMENT & PLAN:  1. Cough present for greater than 3 weeks    I have personally viewed and independently interpreted the imaging studies ordered this visit. CXR: no acute changes.  Long-standing intermittent similar symptoms. Ques possible GERD etiology; discussed. Trial of Protonix . Cough is affecting sleep; cough med given.  Meds ordered this encounter  Medications   albuterol  (VENTOLIN  HFA) 108 (90 Base) MCG/ACT inhaler    Sig: Inhale 2 puffs into the lungs every 6 (six) hours as needed for wheezing or shortness of breath.    Dispense:  8 g    Refill:  2   HYDROcodone  bit-homatropine (HYCODAN) 5-1.5 MG/5ML syrup    Sig: Take 5 mLs by mouth every 6 (six) hours as needed for cough.    Dispense:  90 mL    Refill:  0   pantoprazole  (PROTONIX ) 40 MG tablet    Sig: Take 1 tablet (40 mg total) by mouth daily.    Dispense:  30 tablet    Refill:  2   May need allergy/pulm referral if not improving over next few weeks.  Results for orders placed or performed during the hospital encounter of 10/23/23  POC Covid19/Flu A&B Antigen   Collection Time: 10/23/23  7:41 PM  Result Value Ref Range   Influenza A Antigen, POC Negative Negative   Influenza B Antigen, POC Negative Negative   Covid Antigen, POC Negative Negative     Follow-up Information     [facility].   Specialty: Urgent Care Why: If worsening or failing to improve as anticipated. Contact information: [address] [place]  [address] [phone]                Reviewed expectations re: course of current medical issues. Questions answered. Outlined signs and symptoms indicating need for more acute intervention. Understanding verbalized. After Visit Summary given.   SUBJECTIVE: History from: Patient. Johnny Carney is a 23 y.o. male. Pt states cough for the past 3 months.  Seen here 3 times  for the same.  States now he has the chills and headache for the past 2 days.  Has been seen in past for same. Denies: fever. Normal PO intake without n/v/d.  OBJECTIVE:  Vitals:   10/23/23 1901  BP: (!) 145/92  Pulse: 95  Resp: 16  Temp: 98.9 F (37.2 C)  TempSrc: Oral  SpO2: 95%    General appearance: alert; no distress Eyes: PERRLA; EOMI; conjunctiva normal HENT: [place]; AT; without nasal congestion Neck: supple  Lungs: speaks full sentences without difficulty; unlabored; CTAB; active dry coughing while here Extremities: no edema Skin: warm and dry Neurologic: normal gait Psychological: alert and cooperative; normal mood and affect  Labs: Results for orders placed or performed during the hospital encounter of 10/23/23  POC Covid19/Flu A&B Antigen   Collection Time: 10/23/23  7:41 PM  Result Value Ref Range   Influenza A Antigen, POC Negative Negative   Influenza B Antigen, POC Negative Negative   Covid Antigen, POC Negative Negative   Labs Reviewed  POC COVID19/FLU A&B COMBO    Imaging: DG Chest 2 View Result Date: 10/23/2023 EXAM: 2 VIEW(S) XRAY OF THE CHEST 10/23/2023 07:23:57 PM COMPARISON: 08/28/2023 CLINICAL HISTORY: Cough > 3 months. Pt states cough for the past 3 months. Seen here 3 times for the same. States now he has the chills and headache for the past 2 days. FINDINGS:  LUNGS AND PLEURA: No focal pulmonary opacity. No pulmonary edema. No pleural effusion. No pneumothorax. HEART AND MEDIASTINUM: No acute abnormality of the cardiac and mediastinal silhouettes. BONES AND SOFT TISSUES: No acute osseous abnormality. IMPRESSION: 1. No acute process. Electronically signed by: Pinkie Pebbles MD 10/23/2023 07:26 PM EDT RP Workstation: [id number]    No Known Allergies  History reviewed. No pertinent past medical history. Social History   Socioeconomic History   Marital status: Single    Spouse name: Not on file   Number of children: Not on file   Years of  education: Not on file   Highest education level: Not on file  Occupational History   Not on file  Tobacco Use   Smoking status: Never   Smokeless tobacco: Never  Vaping Use   Vaping status: Never Used  Substance and Sexual Activity   Alcohol use: No   Drug use: No   Sexual activity: Not on file  Other Topics Concern   Not on file  Social History Narrative   Not on file   Social Drivers of Health   Financial Resource Strain: Not on file  Food Insecurity: Not on file  Transportation Needs: Not on file  Physical Activity: Not on file  Stress: Not on file  Social Connections: Not on file  Intimate Partner Violence: Not on file   History reviewed. No pertinent family history. History reviewed. No pertinent surgical history.   Rolinda Rogue, MD 10/24/23 [phone]

## 2023-12-27 IMAGING — DX DG FOOT COMPLETE 3+V*R*
3 series · 3 of 3 positions shown · non-contrast
Comparison: Right great toe radiographs 03/06/2014

CLINICAL DATA: Large object fell on right foot.

EXAM:
RIGHT FOOT COMPLETE - 3+ VIEW

[foot ap]
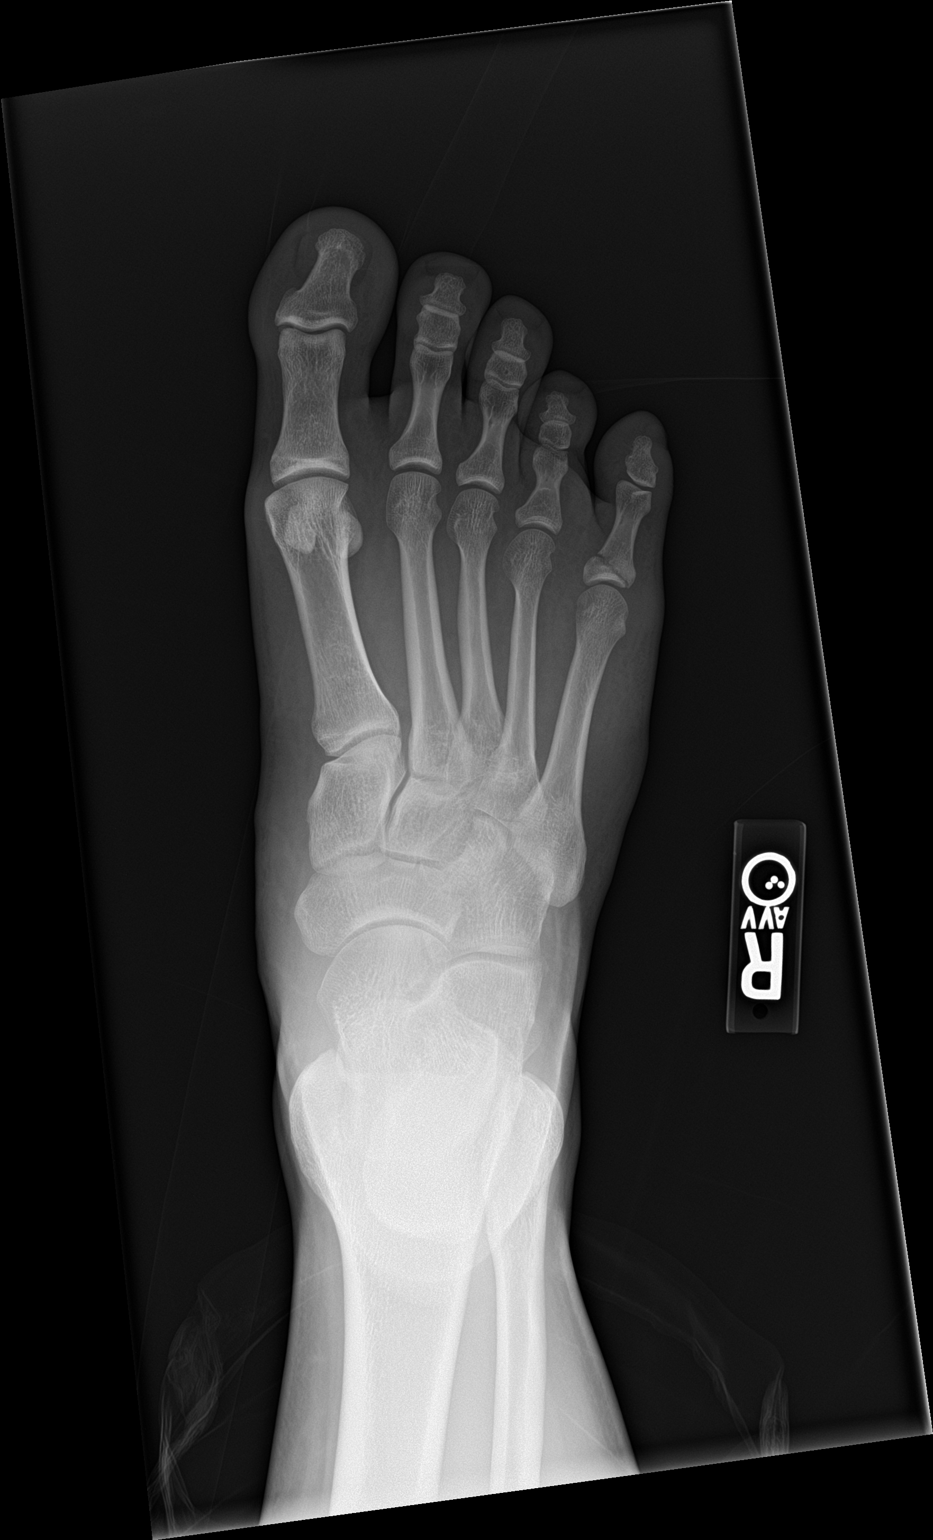

[foot obl]
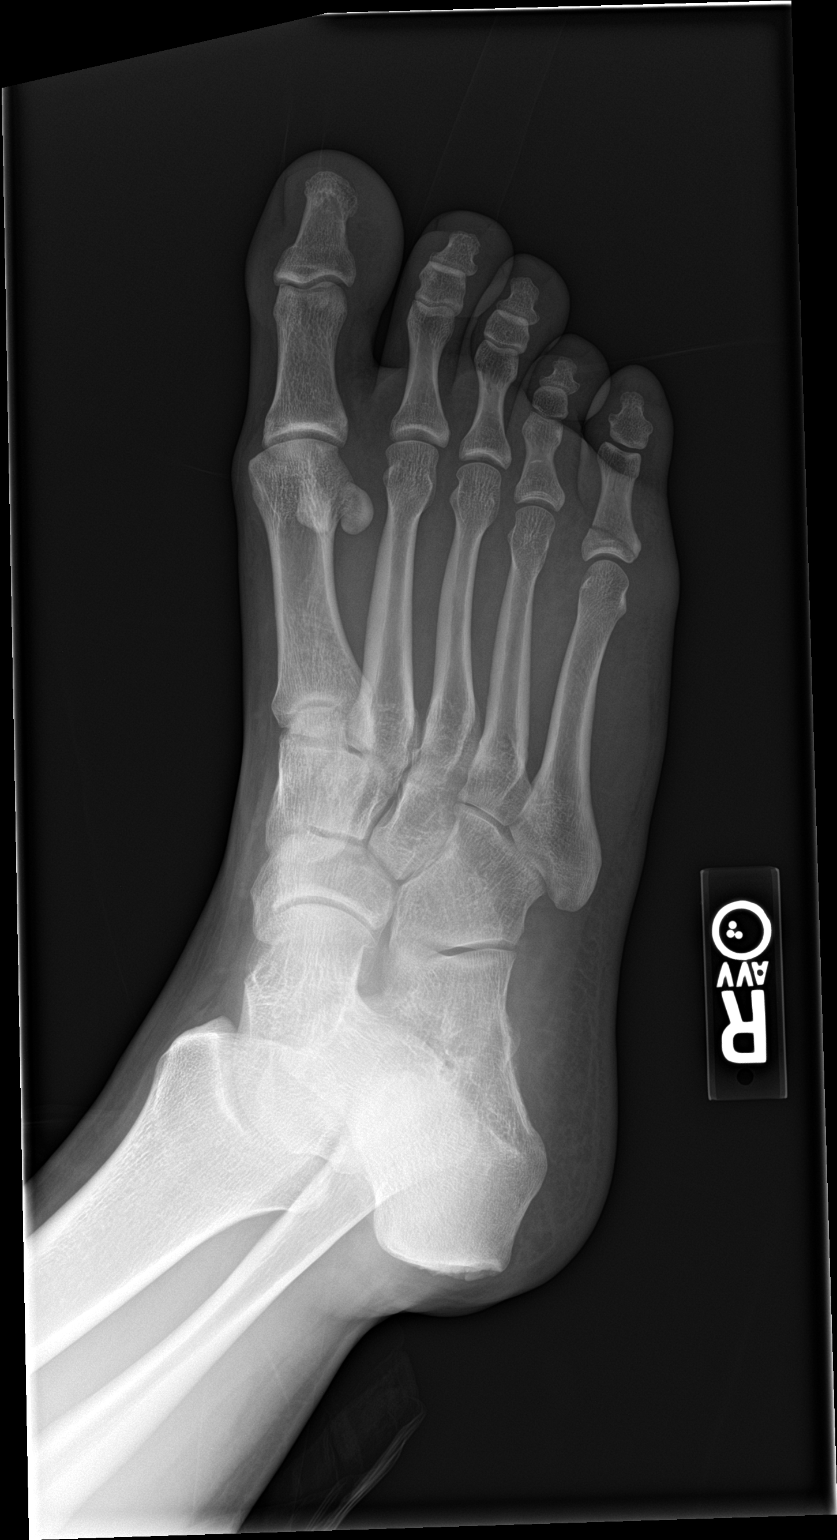

[foot lat]
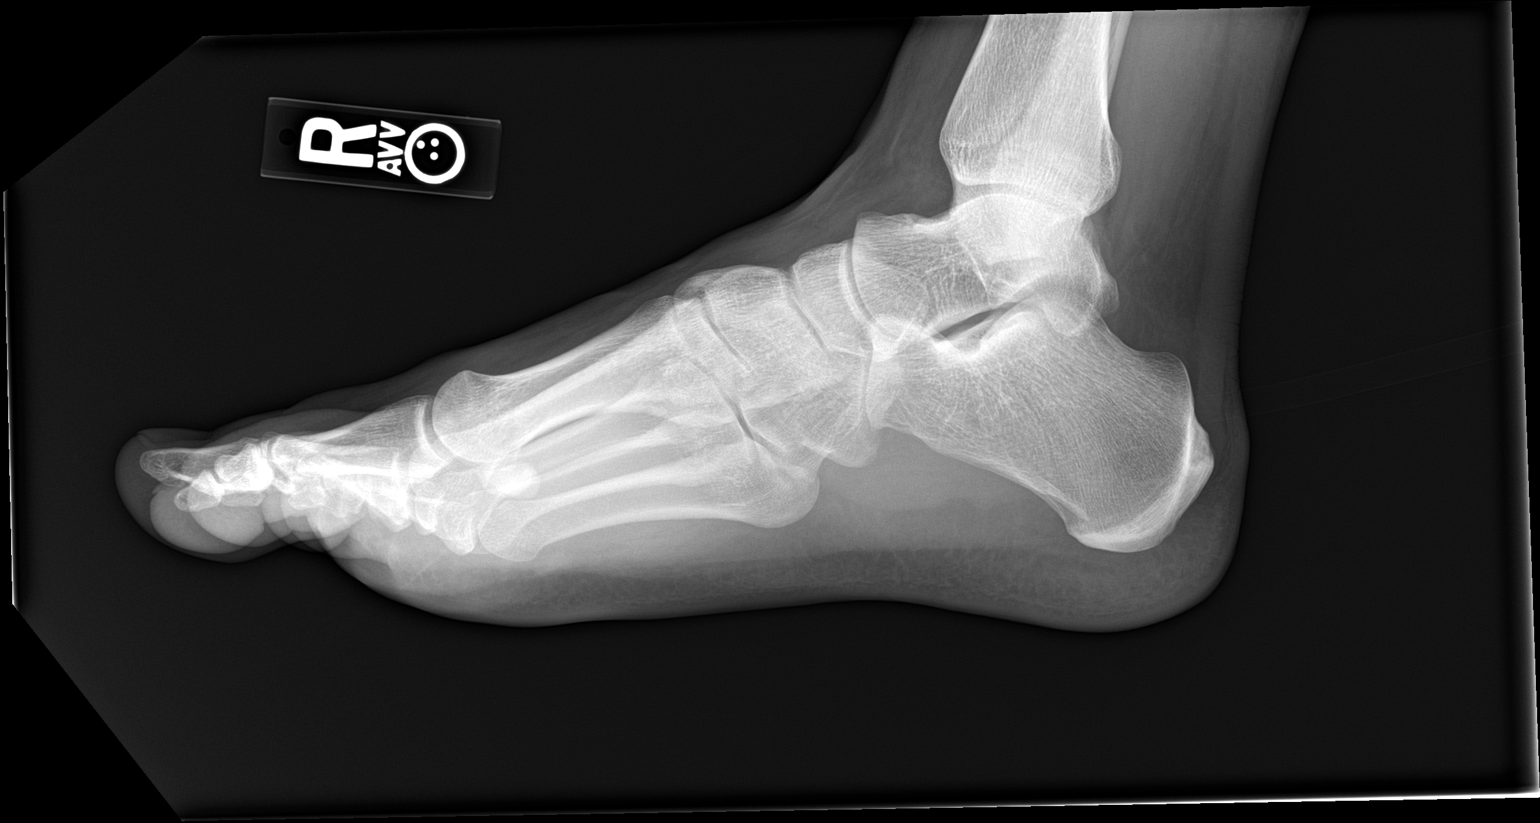

[3 of 3 positions shown; findings below may reference images not displayed]

FINDINGS: Oblique minimally displaced fracture at the base of the fifth toe
proximal phalanx, extending to the lateral aspect of the articular
surface. The distal fracture fragment is displaced approximately 1
mm laterally. No significant angulation. The fifth toe MTP joint
space is intact. Soft tissue swelling is noted at the base of the
fifth toe.

No additional fractures in the right foot. Fusion of the fifth toe
middle and distal phalanges is likely congenital.
IMPRESSION: Intra-articular oblique minimally displaced fracture of the base of
the fifth toe proximal phalanx. The fracture line extends to the
lateral aspect of the articular surface.

## 2023-12-28 ENCOUNTER — Ambulatory Visit: Admitting: Family Medicine

## 2024-03-31 ENCOUNTER — Emergency Department (HOSPITAL_COMMUNITY)

## 2024-03-31 ENCOUNTER — Emergency Department (HOSPITAL_COMMUNITY)
Admission: EM | Admit: 2024-03-31 | Discharge: 2024-03-31 | Discharge status: Against Medical Advice | Attending: Emergency Medicine | Admitting: Emergency Medicine

## 2024-03-31 ENCOUNTER — Other Ambulatory Visit: Payer: Self-pay

## 2024-03-31 ENCOUNTER — Ambulatory Visit (HOSPITAL_COMMUNITY)
Admission: EM | Admit: 2024-03-31 | Discharge: 2024-03-31 | Disposition: A | Discharge status: Home / Self Care | Attending: Internal Medicine | Admitting: Internal Medicine

## 2024-03-31 ENCOUNTER — Encounter (HOSPITAL_COMMUNITY): Payer: Self-pay | Admitting: Emergency Medicine

## 2024-03-31 DIAGNOSIS — R0989 Other specified symptoms and signs involving the circulatory and respiratory systems: Secondary | ICD-10-CM | POA: Insufficient documentation

## 2024-03-31 DIAGNOSIS — Z5321 Procedure and treatment not carried out due to patient leaving prior to being seen by health care provider: Secondary | ICD-10-CM | POA: Diagnosis not present

## 2024-03-31 DIAGNOSIS — J069 Acute upper respiratory infection, unspecified: Secondary | ICD-10-CM

## 2024-03-31 DIAGNOSIS — R059 Cough, unspecified: Secondary | ICD-10-CM | POA: Insufficient documentation

## 2024-03-31 DIAGNOSIS — J4521 Mild intermittent asthma with (acute) exacerbation: Secondary | ICD-10-CM

## 2024-03-31 DIAGNOSIS — R0981 Nasal congestion: Secondary | ICD-10-CM | POA: Insufficient documentation

## 2024-03-31 HISTORY — DX: Unspecified asthma, uncomplicated: J45.909

## 2024-03-31 MED ORDER — AZITHROMYCIN 250 MG PO TABS: ORAL_TABLET | ORAL | 0 refills | Status: AC

## 2024-03-31 MED ORDER — ALBUTEROL SULFATE HFA 108 (90 BASE) MCG/ACT IN AERS: 2.0000 | INHALATION_SPRAY | Freq: Four times a day (QID) | RESPIRATORY_TRACT | 2 refills | Status: AC | PRN

## 2024-03-31 MED ORDER — PROMETHAZINE-DM 6.25-15 MG/5ML PO SYRP: 5.0000 mL | ORAL_SOLUTION | Freq: Three times a day (TID) | ORAL | 0 refills | Status: AC | PRN

## 2024-03-31 MED ORDER — PREDNISONE 20 MG PO TABS: 40.0000 mg | ORAL_TABLET | Freq: Every day | ORAL | 0 refills | Status: AC

## 2024-03-31 NOTE — ED Notes (Signed)
 Pt left without being seen. Pt marked as eloped.

## 2024-03-31 NOTE — ED Triage Notes (Signed)
 Patient reports productive cough with chest congestion and runny nose this evening , no fever or chills .

## 2024-03-31 NOTE — ED Triage Notes (Signed)
 Pt reports since christmas eve having cough, congestion headaches and sore throat. Reports having SOB and wheezing and pain with coughing and breathing. Taking dayquil, nyquil, promethazine , inhaler. Went to ED last night and had chest xray but left after that due to long wait times.

## 2024-03-31 NOTE — ED Provider Notes (Signed)
 " [facility]    CSN: [id number] Arrival date & time: 03/31/24  1511      History   Chief Complaint Chief Complaint  Patient presents with   Cough   Nasal Congestion    HPI Johnny Carney is a 23 y.o. male.   23 y.o. male who presents to urgent care with complaints of wheezing, shortness of breath, cough, headaches, sore throat and fatigue.  His symptoms started around Christmas Eve.  His symptoms have gotten worse.  He has a asthma inhaler and he has been using it more frequently.  He denies any nausea or vomiting.  He has not had a good appetite.  He missed work yesterday due to the severity of his symptoms.  He did go to the emergency room last night and had a chest x-ray done but left before being seen.   Cough Associated symptoms: headaches, shortness of breath, sore throat and wheezing   Associated symptoms: no chest pain, no chills, no ear pain, no fever and no rash     Past Medical History:  Diagnosis Date   Asthma     There are no active problems to display for this patient.   History reviewed. No pertinent surgical history.     Home Medications    Prior to Admission medications  Medication Sig Start Date End Date Taking? Authorizing Provider  azithromycin (ZITHROMAX) 250 MG tablet Take first 2 tablets together, then 1 every day until finished. 03/31/24  Yes Vernella Niznik A, PA-C  predniSONE  (DELTASONE ) 20 MG tablet Take 2 tablets (40 mg total) by mouth daily with breakfast for 5 days. 03/31/24 04/05/24 Yes Joanann Mies A, PA-C  promethazine -dextromethorphan (PROMETHAZINE -DM) 6.25-15 MG/5ML syrup Take 5 mLs by mouth every 8 (eight) hours as needed for cough. 03/31/24  Yes Lulubelle Simcoe A, PA-C  albuterol  (VENTOLIN  HFA) 108 (90 Base) MCG/ACT inhaler Inhale 2 puffs into the lungs every 6 (six) hours as needed for wheezing or shortness of breath. 03/31/24   Jerrod Damiano A, PA-C  HYDROcodone  bit-homatropine (HYCODAN) 5-1.5 MG/5ML syrup Take 5  mLs by mouth every 6 (six) hours as needed for cough. 10/23/23   Rolinda Rogue, MD  pantoprazole  (PROTONIX ) 40 MG tablet Take 1 tablet (40 mg total) by mouth daily. 10/23/23   Rolinda Rogue, MD    Family History History reviewed. No pertinent family history.  Social History Social History[1]   Allergies   Patient has no known allergies.   Review of Systems Review of Systems  Constitutional:  Positive for fatigue. Negative for chills and fever.  HENT:  Positive for congestion and sore throat. Negative for ear pain.   Eyes:  Negative for pain and visual disturbance.  Respiratory:  Positive for cough, shortness of breath and wheezing.   Cardiovascular:  Negative for chest pain and palpitations.  Gastrointestinal:  Negative for abdominal pain and vomiting.  Genitourinary:  Negative for dysuria and hematuria.  Musculoskeletal:  Negative for arthralgias and back pain.  Skin:  Negative for color change and rash.  Neurological:  Positive for headaches. Negative for seizures and syncope.  All other systems reviewed and are negative.    Physical Exam Triage Vital Signs ED Triage Vitals  Encounter Vitals Group     BP 03/31/24 1638 130/89     Girls Systolic BP Percentile --      Girls Diastolic BP Percentile --      Boys Systolic BP Percentile --      Boys Diastolic BP Percentile --  Pulse Rate 03/31/24 1638 (!) 102     Resp 03/31/24 1638 18     Temp 03/31/24 1638 98.2 F (36.8 C)     Temp Source 03/31/24 1638 Oral     SpO2 03/31/24 1638 97 %     Weight --      Height --      Head Circumference --      Peak Flow --      Pain Score 03/31/24 1636 7     Pain Loc --      Pain Education --      Exclude from Growth Chart --    No data found.  Updated Vital Signs BP 130/89 (BP Location: Left Arm)   Pulse (!) 102   Temp 98.2 F (36.8 C) (Oral)   Resp 18   SpO2 97%   Visual Acuity Right Eye Distance:   Left Eye Distance:   Bilateral Distance:    Right Eye Near:    Left Eye Near:    Bilateral Near:     Physical Exam Vitals and nursing note reviewed.  Constitutional:      General: He is not in acute distress.    Appearance: He is well-developed.  HENT:     Head: Normocephalic and atraumatic.  Eyes:     Conjunctiva/sclera: Conjunctivae normal.  Cardiovascular:     Rate and Rhythm: Normal rate and regular rhythm.     Heart sounds: No murmur heard. Pulmonary:     Effort: Pulmonary effort is normal. No tachypnea or respiratory distress.     Breath sounds: Examination of the right-lower field reveals decreased breath sounds. Examination of the left-lower field reveals decreased breath sounds. Decreased breath sounds present. No wheezing or rhonchi.  Abdominal:     Palpations: Abdomen is soft.     Tenderness: There is no abdominal tenderness.  Musculoskeletal:        General: No swelling.     Cervical back: Neck supple.  Skin:    General: Skin is warm and dry.     Capillary Refill: Capillary refill takes less than 2 seconds.  Neurological:     Mental Status: He is alert.  Psychiatric:        Mood and Affect: Mood normal.      UC Treatments / Results  Labs (all labs ordered are listed, but only abnormal results are displayed) Labs Reviewed - No data to display  EKG   Radiology DG Chest 2 View Result Date: 03/31/2024 EXAM: 2 VIEW(S) XRAY OF THE CHEST 03/31/2024 01:07:31 AM COMPARISON: 10/23/2023 CLINICAL HISTORY: SOB / Cough FINDINGS: LUNGS AND PLEURA: Low lung volume. No focal pulmonary opacity. No pleural effusion. No pneumothorax. HEART AND MEDIASTINUM: No acute abnormality of the cardiac and mediastinal silhouettes. BONES AND SOFT TISSUES: No acute osseous abnormality. IMPRESSION: 1. Low lung volume. Electronically signed by: Dorethia Molt MD 03/31/2024 01:13 AM EST RP Workstation: [id number]    Procedures Procedures (including critical care time)  Medications Ordered in UC Medications - No data to display  Initial Impression  / Assessment and Plan / UC Course  I have reviewed the triage vital signs and the nursing notes.  Pertinent labs & imaging results that were available during my care of the patient were reviewed by me and considered in my medical decision making (see chart for details).     Acute upper respiratory infection  Mild intermittent asthma with acute exacerbation   Symptoms and physical exam findings are consistent with an acute respiratory  infection likely secondary to bacterial source due to duration and causing an acute exacerbation of asthma.  Reviewed chest x-ray from the hospital which showed no acute process.  We will treat with the following:  Azithromycin 250mg  Take 2 tablets today and the 1 tablet daily for 4 more days. Prednisone  40 mg (2 tablets) once daily for 5 days. Take this in the morning.  This is a steroid to help with inflammation and pain. Albuterol  inhaler 1-2 puffs every 6 hours as needed for wheezing/shortness of breath. Promethazine  DM 5 mL every 8 hours as needed for cough.  Use caution as this medication can cause drowsiness. Make sure to stay hydrated by drinking plenty of water. Return to urgent care or PCP if symptoms worsen or fail to resolve  Final Clinical Impressions(s) / UC Diagnoses   Final diagnoses:  Acute upper respiratory infection  Mild intermittent asthma with acute exacerbation     Discharge Instructions      Symptoms and physical exam findings are consistent with an acute respiratory infection likely secondary to bacterial source due to duration and causing an acute exacerbation of asthma. We will treat with the following:  Azithromycin 250mg  Take 2 tablets today and the 1 tablet daily for 4 more days. Prednisone  40 mg (2 tablets) once daily for 5 days. Take this in the morning.  This is a steroid to help with inflammation and pain. Albuterol  inhaler 1-2 puffs every 6 hours as needed for wheezing/shortness of breath. Promethazine  DM 5 mL every 8  hours as needed for cough.  Use caution as this medication can cause drowsiness. Make sure to stay hydrated by drinking plenty of water. Return to urgent care or PCP if symptoms worsen or fail to resolve.      ED Prescriptions     Medication Sig Dispense Auth. Provider   albuterol  (VENTOLIN  HFA) 108 (90 Base) MCG/ACT inhaler Inhale 2 puffs into the lungs every 6 (six) hours as needed for wheezing or shortness of breath. 8 g Tionne Dayhoff A, PA-C   azithromycin (ZITHROMAX) 250 MG tablet Take first 2 tablets together, then 1 every day until finished. 6 tablet Teresa Norris A, PA-C   predniSONE  (DELTASONE ) 20 MG tablet Take 2 tablets (40 mg total) by mouth daily with breakfast for 5 days. 10 tablet Leonardo Plaia A, PA-C   promethazine -dextromethorphan (PROMETHAZINE -DM) 6.25-15 MG/5ML syrup Take 5 mLs by mouth every 8 (eight) hours as needed for cough. 180 mL Teresa Norris LABOR, [place]      PDMP not reviewed this encounter.    [1]  Social History Tobacco Use   Smoking status: Never   Smokeless tobacco: Never  Vaping Use   Vaping status: Never Used  Substance Use Topics   Alcohol use: No   Drug use: No     Teresa Norris LABOR, PA-C 03/31/24 1711  "

## 2024-03-31 NOTE — Discharge Instructions (Addendum)
 Symptoms and physical exam findings are consistent with an acute respiratory infection likely secondary to bacterial source due to duration and causing an acute exacerbation of asthma. We will treat with the following:  Azithromycin 250mg  Take 2 tablets today and the 1 tablet daily for 4 more days. Prednisone  40 mg (2 tablets) once daily for 5 days. Take this in the morning.  This is a steroid to help with inflammation and pain. Albuterol  inhaler 1-2 puffs every 6 hours as needed for wheezing/shortness of breath. Promethazine  DM 5 mL every 8 hours as needed for cough.  Use caution as this medication can cause drowsiness. Make sure to stay hydrated by drinking plenty of water. Return to urgent care or PCP if symptoms worsen or fail to resolve.
